# Patient Record
Sex: Male | Born: 1985 | Race: White | Hispanic: No | Marital: Married | Smoking: Current some day smoker
Health system: Southern US, Community
[De-identification: ages and names within clinical notes are randomized; demographics above are authoritative.]

## PROBLEM LIST (undated history)

## (undated) DIAGNOSIS — K219 Gastro-esophageal reflux disease without esophagitis: Secondary | ICD-10-CM

## (undated) DIAGNOSIS — F419 Anxiety disorder, unspecified: Secondary | ICD-10-CM

## (undated) DIAGNOSIS — B019 Varicella without complication: Secondary | ICD-10-CM

## (undated) HISTORY — DX: Anxiety disorder, unspecified: F41.9

## (undated) HISTORY — DX: Gastro-esophageal reflux disease without esophagitis: K21.9

## (undated) HISTORY — DX: Varicella without complication: B01.9

---

## 2006-11-04 ENCOUNTER — Other Ambulatory Visit: Payer: Self-pay

## 2006-11-04 ENCOUNTER — Emergency Department: Payer: Self-pay | Admitting: General Practice

## 2011-07-23 ENCOUNTER — Ambulatory Visit: Payer: Self-pay

## 2013-05-28 IMAGING — CT CT ABD-PELV W/O CM
1 of 2 series · 15 of 32 positions shown, 19 images · non-contrast
Comparison: None

REASON FOR EXAM: STAT CR [id number] severe abd pain
COMMENTS:

PROCEDURE:     CT  - CT ABDOMEN AND PELVIS W[DATE] [DATE]
RESULT:     Indication: Abdominal pain
TECHNIQUE: Multiple axial images from the lung bases to the symphysis pubis
were obtained without oral and without intravenous contrast.

[Series 2: soft tissue · axial · 0.67mm/px · z∈[-546,-138]mm · 15 of 150 slices shown, 19 images]
[im 7/150  soft-tissue]
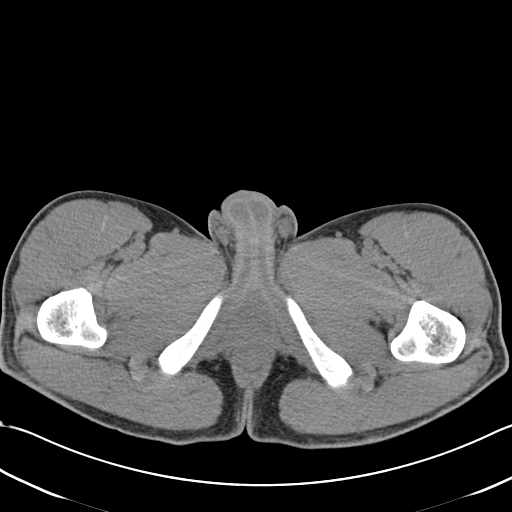
[im 7/150  bone]
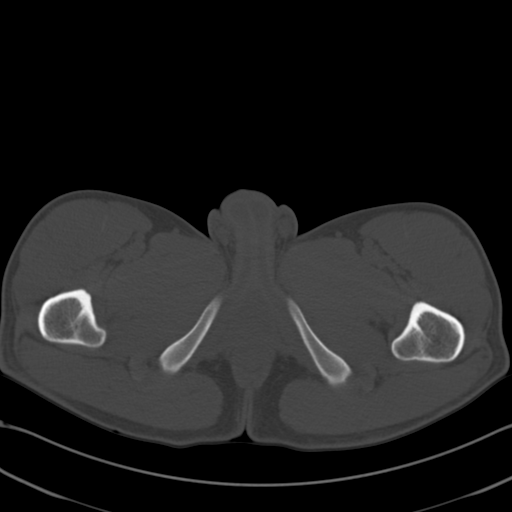
[im 19/150  soft-tissue]
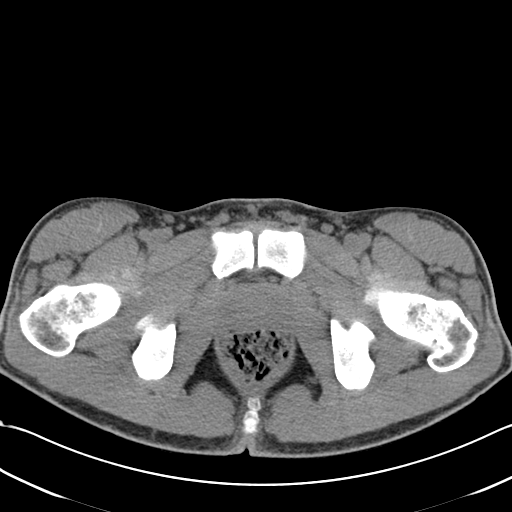
[im 32/150  soft-tissue]
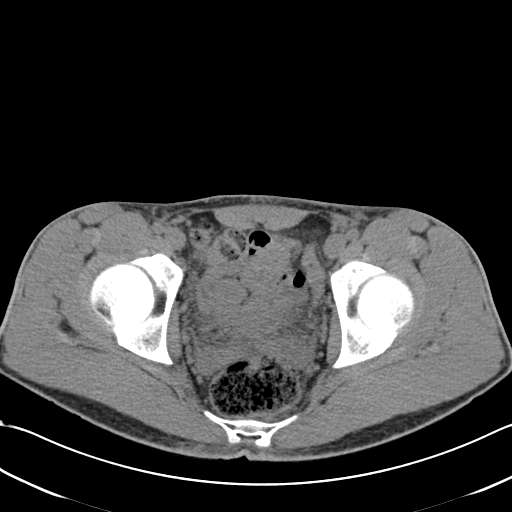
[im 44/150  soft-tissue]
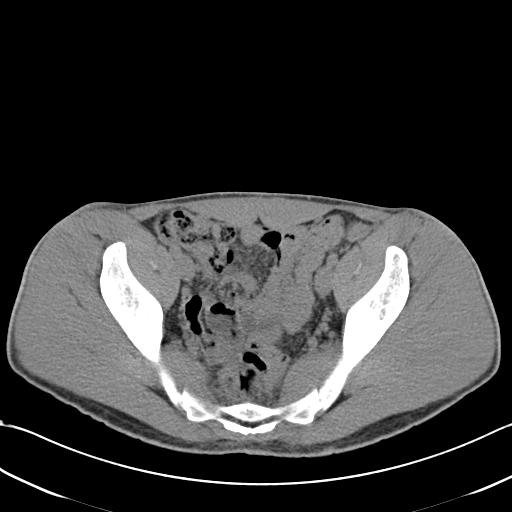
[im 50/150  soft-tissue]
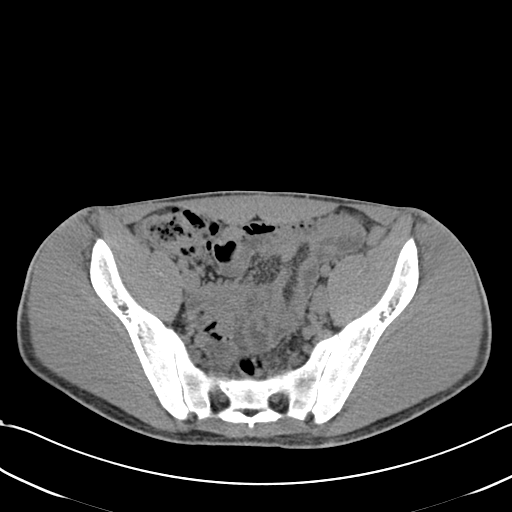
[im 63/150  soft-tissue]
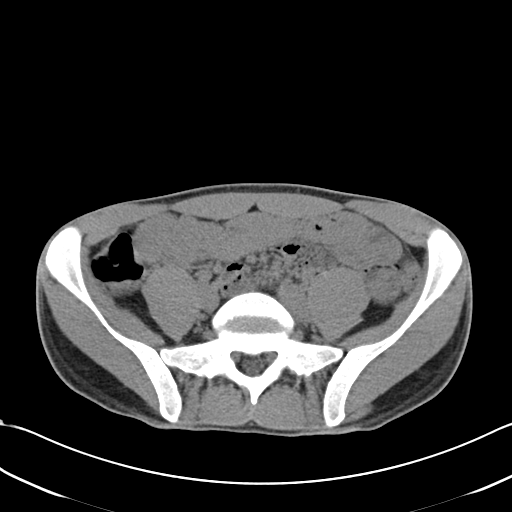
[im 75/150  soft-tissue]
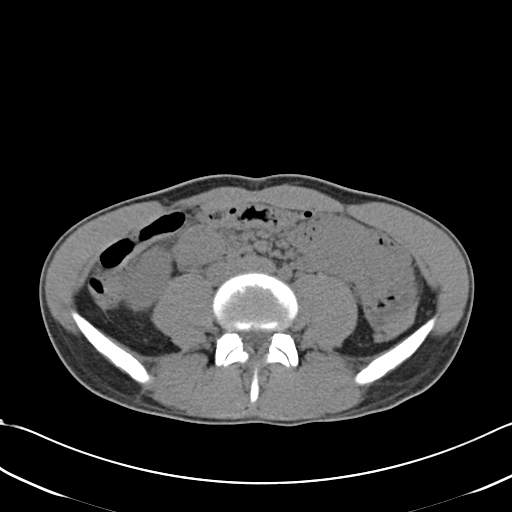
[im 87/150  soft-tissue]
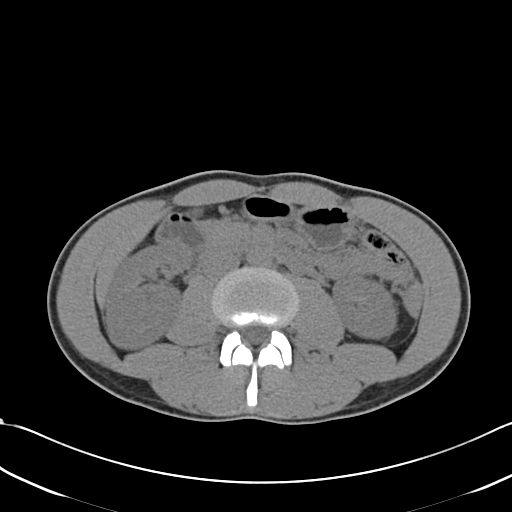
[im 100/150  soft-tissue]
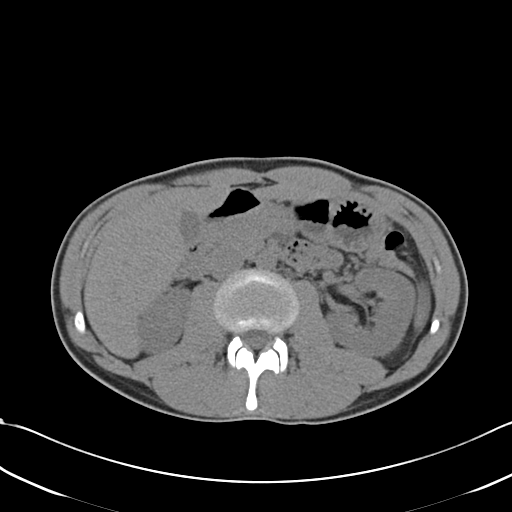
[im 100/150  bone]
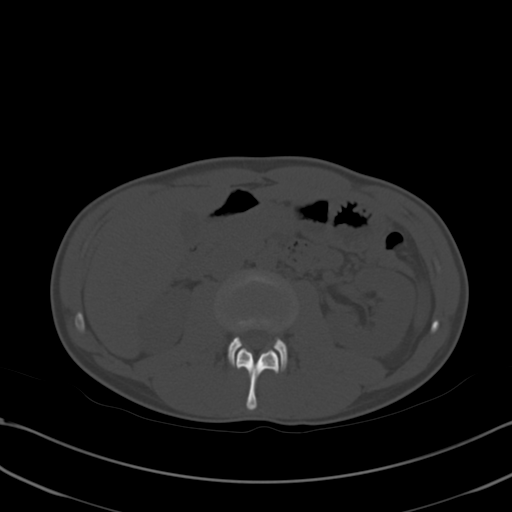
[im 106/150  soft-tissue]
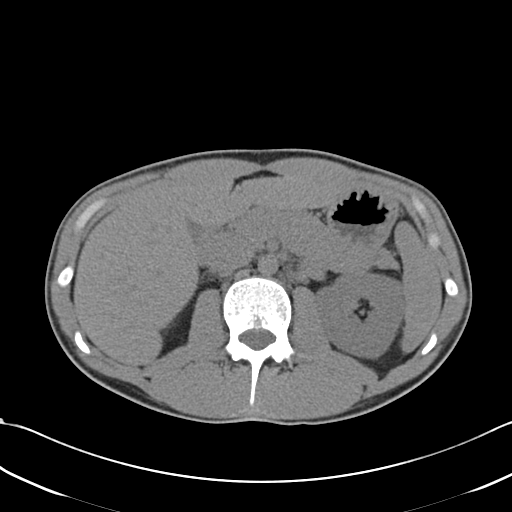
[im 118/150  soft-tissue]
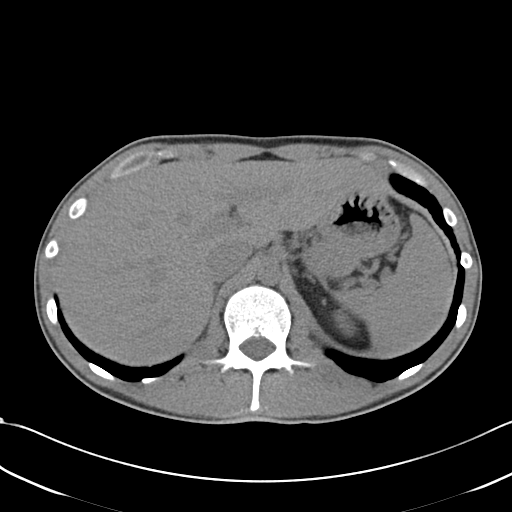
[im 125/150  lung]
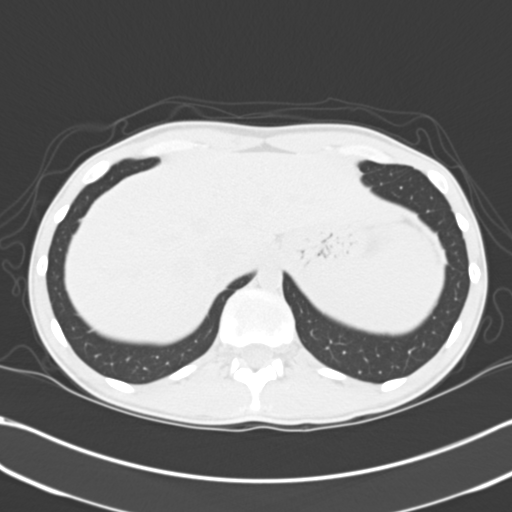
[im 131/150  soft-tissue]
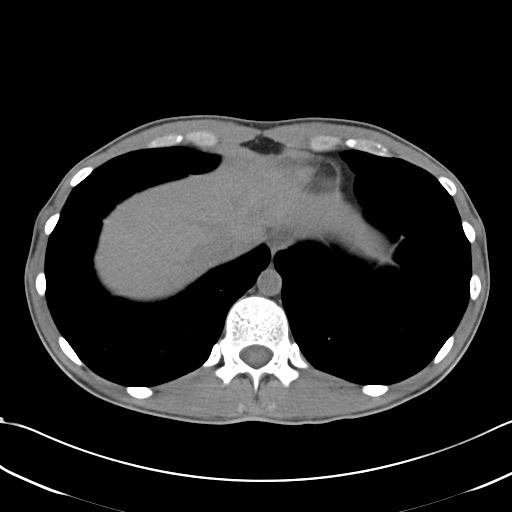
[im 131/150  lung]
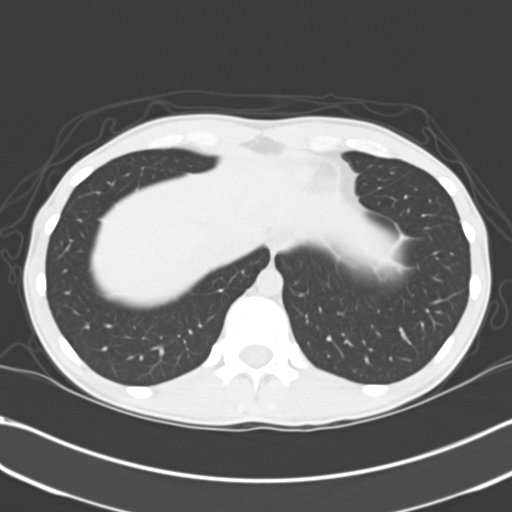
[im 137/150  lung]
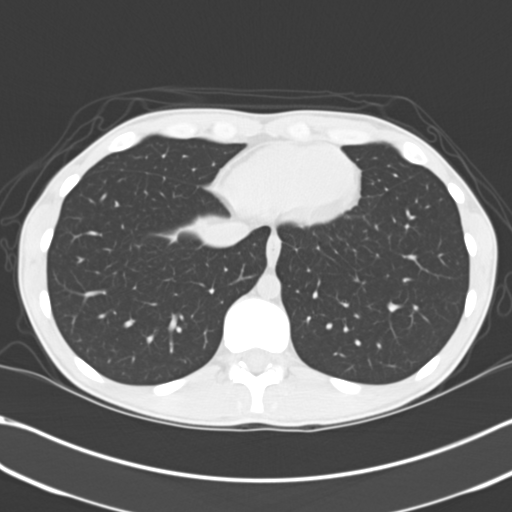
[im 143/150  soft-tissue]
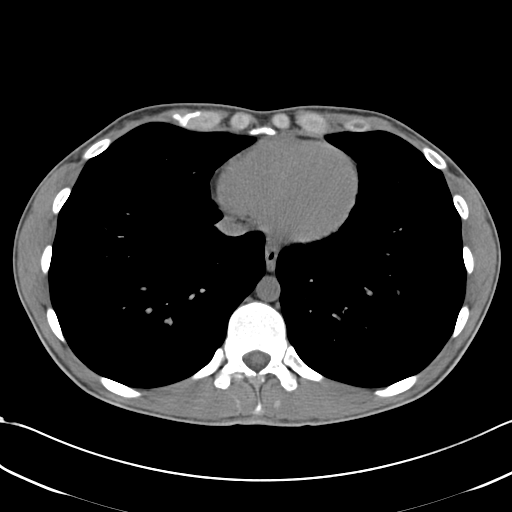
[im 143/150  lung]
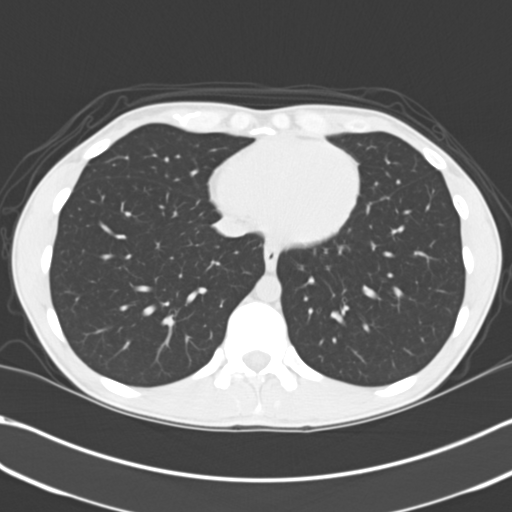

[15 of 32 positions shown; findings below may reference images not displayed]

FINDINGS: The lung bases are clear. There is no pleural or pericardial effusions.

There is a punctate nonobstructing right renal calculus. No obstructive
uropathy. No perinephric stranding is seen. The kidneys are symmetric in
size without evidence for exophytic mass. The bladder is unremarkable.

The liver demonstrates no focal abnormality. The gallbladder is
unremarkable. The spleen demonstrates no focal abnormality. The adrenal
glands and pancreas are normal.

The unopacified stomach, duodenum, small intestine, and large intestine are
unremarkable, but evaluation is limited by lack of oral contrast.  There is
no pneumoperitoneum, pneumatosis, or portal venous gas. There is no
abdominal or pelvic free fluid. There is no lymphadenopathy.

The abdominal aorta is normal in caliber .

The osseous structures are unremarkable.
IMPRESSION: 1. Punctate nonobstructing right renal calculus.

## 2017-11-03 DIAGNOSIS — B078 Other viral warts: Secondary | ICD-10-CM | POA: Diagnosis not present

## 2018-03-18 DIAGNOSIS — J020 Streptococcal pharyngitis: Secondary | ICD-10-CM | POA: Diagnosis not present

## 2018-03-18 DIAGNOSIS — J029 Acute pharyngitis, unspecified: Secondary | ICD-10-CM | POA: Diagnosis not present

## 2018-08-26 DIAGNOSIS — J0301 Acute recurrent streptococcal tonsillitis: Secondary | ICD-10-CM | POA: Diagnosis not present

## 2018-11-23 ENCOUNTER — Ambulatory Visit: Payer: Self-pay | Admitting: Family Medicine

## 2018-11-24 ENCOUNTER — Ambulatory Visit: Payer: Self-pay | Admitting: Family Medicine

## 2018-11-25 ENCOUNTER — Encounter: Payer: Self-pay | Admitting: Family Medicine

## 2018-11-25 ENCOUNTER — Ambulatory Visit: Payer: 59 | Admitting: Family Medicine

## 2018-11-25 VITALS — BP 100/60 | HR 77 | Temp 97.8°F | Resp 16 | Ht 71.0 in | Wt 156.4 lb

## 2018-11-25 DIAGNOSIS — M549 Dorsalgia, unspecified: Secondary | ICD-10-CM | POA: Diagnosis not present

## 2018-11-25 DIAGNOSIS — F419 Anxiety disorder, unspecified: Secondary | ICD-10-CM | POA: Diagnosis not present

## 2018-11-25 DIAGNOSIS — Z566 Other physical and mental strain related to work: Secondary | ICD-10-CM

## 2018-11-25 MED ORDER — HYDROXYZINE HCL 10 MG PO TABS: 10.0000 mg | ORAL_TABLET | Freq: Three times a day (TID) | ORAL | 1 refills | Status: DC | PRN

## 2018-11-25 NOTE — Progress Notes (Signed)
Subjective:    Patient ID: Keith Frost, male    DOB: 01/13/1986, 33 y.o.   MRN: [id number]  HPI   Patient presents in clinic to establish with PCP.  Main concern today is anxiety.  Patient has always struggled with anxiety over the years, but due to his job being very stressful and demanding his wife has noticed his anxiety is worse and would like him to have something for it.  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  This is been great for their business and making money, but is been very demanding on his time.  States he has been struggling to balance his work life and home life, he is married and has a young child.  He is hoping once they get all these jobs scheduled and managed for the busy season he will better be able to figure out how to balance work life and home life.  Denies any SI or HI.  Patient also has some pain in the left upper back.  He is working out at [employer] but more over the past few months to try and gain weight and muscle mass.  He did see the chiropractor approximately 6 months ago, had x-rays done which did not reveal any bony abnormality and had multiple adjustments performed.  Patient states since going to the chiropractor the pain seems somewhat better but he will notice it at times especially if he has to use both arms when running equipment at work.   Past Medical History:  Diagnosis Date  . Chicken pox   . GERD (gastroesophageal reflux disease)    Social History   Tobacco Use  . Smoking status: Former Games developer  . Smokeless tobacco: Never Used  Substance Use Topics  . Alcohol use: Yes    Comment: ocassionally   History reviewed. No pertinent surgical history.   Family History  Problem Relation Age of Onset  . Diabetes Father   . Stroke Father   . Hypertension Father   . Cancer Maternal Grandfather   . Heart attack Paternal Grandmother    . Diabetes Paternal Grandfather   . Arthritis Paternal Grandfather   . Heart attack Paternal Grandfather    Review of Systems  Constitutional: Negative for chills, fatigue and fever.  HENT: Negative for congestion, ear pain, sinus pain and sore throat.   Eyes: Negative.   Respiratory: Negative for cough, shortness of breath and wheezing.   Cardiovascular: Negative for chest pain, palpitations and leg swelling.  Gastrointestinal: Negative for abdominal pain, diarrhea, nausea and vomiting.  Genitourinary: Negative for dysuria, frequency and urgency.  Musculoskeletal: +upper back pain left Skin: Negative for color change, pallor and rash.  Neurological: Negative for syncope, light-headedness and headaches.  Psychiatric/Behavioral: The patient is anxious/increased stress.       Objective:   Physical Exam Vitals signs and nursing note reviewed.  Constitutional:      General: He is not in acute distress.    Appearance: He is not toxic-appearing.  HENT:     Head: Normocephalic and atraumatic.     Right Ear: Tympanic membrane, ear canal and external ear normal.     Left Ear: Tympanic membrane, ear canal and external ear normal.     Nose: Nose normal.     Mouth/Throat:     Mouth: Mucous membranes are moist.  Eyes:  General: No scleral icterus.    Extraocular Movements: Extraocular movements intact.     Pupils: Pupils are equal, round, and reactive to light.  Neck:     Musculoskeletal: Normal range of motion and neck supple. No neck rigidity.  Cardiovascular:     Rate and Rhythm: Normal rate and regular rhythm.  Pulmonary:     Effort: Pulmonary effort is normal. No respiratory distress.     Breath sounds: Normal breath sounds.  Abdominal:     General: Abdomen is flat. Bowel sounds are normal. There is no distension.     Palpations: Abdomen is soft.     Tenderness: There is no abdominal tenderness.  Musculoskeletal: Normal range of motion.       Arms:     Comments:  Location of left upper back pain indicated by red circle on diagram.  No issues with range of motion of left shoulder or neck.  No pain with palpation of AC joint.  No pain with palpation along spinous process.  Grips equal and strong.  Lymphadenopathy:     Cervical: No cervical adenopathy.  Skin:    General: Skin is warm and dry.     Coloration: Skin is not pale.  Neurological:     Mental Status: He is alert and oriented to person, place, and time.     Cranial Nerves: No cranial nerve deficit.     Motor: No weakness.     Gait: Gait normal.  Psychiatric:        Mood and Affect: Mood normal.        Behavior: Behavior normal.        Thought Content: Thought content normal.        Judgment: Judgment normal.    Vitals:   11/25/18 1346  BP: 100/60  Pulse: 77  Resp: 16  Temp: 97.8 F (36.6 C)  SpO2: 97%      Assessment & Plan:    A total of 30  minutes were spent face-to-face with the patient during this encounter and over half of that time was spent on counseling and coordination of care. The patient was counseled on anxiety management, medications, techniques to help reduce anxiety.    Anxiety, stress at work -- patient would prefer not to have to take something every day for anxiety control.  After long discussion, he is agreeable to trial hydroxyzine as needed for anxiety breakthrough.  Discussed different anxiety reduction techniques such as taking deep breaths, taking some time for himself to have solitude and reflect on his thoughts and reflect on his day.  Upper back pain on the left side -- discussed that all remedies he can try such as a heating pad, topical rub like BenGay or Biofreeze, using Tylenol or Motrin.  Patient states he has noticed that while working out at they gym and getting stronger, the pain does seem less and less.  Discussed different options do physical therapy or sports medicine if the pain continues to persist, states he will think about this and let me  know if he would like referral.  Patient will make follow-up visit and complete physical exam appointment when he is available, he is unsure of his work schedule at this time so he will call back and let us know.

## 2018-11-26 ENCOUNTER — Encounter: Payer: Self-pay | Admitting: Family Medicine

## 2018-11-26 DIAGNOSIS — Z566 Other physical and mental strain related to work: Secondary | ICD-10-CM | POA: Insufficient documentation

## 2018-11-26 DIAGNOSIS — M549 Dorsalgia, unspecified: Secondary | ICD-10-CM | POA: Insufficient documentation

## 2018-11-26 DIAGNOSIS — F419 Anxiety disorder, unspecified: Secondary | ICD-10-CM | POA: Insufficient documentation

## 2018-12-20 ENCOUNTER — Ambulatory Visit: Payer: Self-pay | Admitting: Family Medicine

## 2019-06-24 ENCOUNTER — Encounter: Payer: 59 | Admitting: Family Medicine

## 2019-06-24 DIAGNOSIS — Z0289 Encounter for other administrative examinations: Secondary | ICD-10-CM

## 2019-09-08 ENCOUNTER — Other Ambulatory Visit: Payer: Self-pay

## 2019-09-08 DIAGNOSIS — Z20822 Contact with and (suspected) exposure to covid-19: Secondary | ICD-10-CM

## 2019-09-12 LAB — NOVEL CORONAVIRUS, NAA: SARS-CoV-2, NAA: DETECTED — AB

## 2019-12-05 ENCOUNTER — Other Ambulatory Visit: Payer: Self-pay

## 2019-12-05 ENCOUNTER — Ambulatory Visit (INDEPENDENT_AMBULATORY_CARE_PROVIDER_SITE_OTHER): Payer: 59 | Admitting: Nurse Practitioner

## 2019-12-05 ENCOUNTER — Encounter: Payer: Self-pay | Admitting: Nurse Practitioner

## 2019-12-05 VITALS — BP 128/78 | HR 75 | Temp 97.2°F | Resp 18 | Ht 72.0 in | Wt 149.0 lb

## 2019-12-05 DIAGNOSIS — Q159 Congenital malformation of eye, unspecified: Secondary | ICD-10-CM | POA: Diagnosis not present

## 2019-12-05 DIAGNOSIS — F419 Anxiety disorder, unspecified: Secondary | ICD-10-CM | POA: Diagnosis not present

## 2019-12-05 DIAGNOSIS — F329 Major depressive disorder, single episode, unspecified: Secondary | ICD-10-CM

## 2019-12-05 DIAGNOSIS — F32A Depression, unspecified: Secondary | ICD-10-CM

## 2019-12-05 LAB — HEPATIC FUNCTION PANEL
ALT: 9 U/L (ref 0–53)
AST: 15 U/L (ref 0–37)
Albumin: 4.3 g/dL (ref 3.5–5.2)
Alkaline Phosphatase: 60 U/L (ref 39–117)
Bilirubin, Direct: 0.2 mg/dL (ref 0.0–0.3)
Total Bilirubin: 1 mg/dL (ref 0.2–1.2)
Total Protein: 6.9 g/dL (ref 6.0–8.3)

## 2019-12-05 LAB — BASIC METABOLIC PANEL
BUN: 10 mg/dL (ref 6–23)
CO2: 30 mEq/L (ref 19–32)
Calcium: 9.7 mg/dL (ref 8.4–10.5)
Chloride: 101 mEq/L (ref 96–112)
Creatinine, Ser: 0.94 mg/dL (ref 0.40–1.50)
GFR: 92.61 mL/min (ref >60.00)
Glucose, Bld: 81 mg/dL (ref 70–99)
Potassium: 4.1 mEq/L (ref 3.5–5.1)
Sodium: 139 mEq/L (ref 135–145)

## 2019-12-05 LAB — CBC
HCT: 44.4 % (ref 39.0–52.0)
Hemoglobin: 15.1 g/dL (ref 13.0–17.0)
MCHC: 34.1 g/dL (ref 30.0–36.0)
MCV: 95.8 fl (ref 78.0–100.0)
Platelets: 234 10*3/uL (ref 150.0–400.0)
RBC: 4.64 Mil/uL (ref 4.22–5.81)
RDW: 12.8 % (ref 11.5–15.5)
WBC: 6.3 10*3/uL (ref 4.0–10.5)

## 2019-12-05 LAB — TSH: TSH: 0.61 u[IU]/mL (ref 0.35–4.50)

## 2019-12-05 MED ORDER — SERTRALINE HCL 50 MG PO TABS: 50.0000 mg | ORAL_TABLET | Freq: Every day | ORAL | 3 refills | Status: DC

## 2019-12-05 NOTE — Progress Notes (Addendum)
His basic chemistry, CBC, and thyroid labs returned totally normal.  I did make a formal referral to the eye doctor and made changes to the after visit sheet. Please give Keith Frost a newer copy.

## 2019-12-05 NOTE — Progress Notes (Signed)
Subjective:    Patient ID: Keith Frost, male    DOB: May 03, 1986, 34 y.o.   MRN: [id number]  HPI  Patient is in today to follow up on anxiety and to establish care with a new [facility] provider.   Depression/Anxiety:  Keith Frost was last seen in the office 11/25/2018 for anxiety and was prescribed hydroxyzine 10 mg tid. Keith Frost does not remember taking the medication.   Keith Frost reports many year duration of worry, stress, anxiety and some depression, although Keith Frost does not like that  word. Keith Frost has never taken medication or sought counseling. Keith Frost is now agreeable to starting medication and counseling. Keith Frost reports exacerbation of his sx over the last 2-3 mos. Keith Frost has had a  "rough time" with his businees and marriage and feels constant stress, worry, irritability, and has a short temper. Keith Frost reports poor sleep:  bed around 23:00 and is up at 05:30. Keith Frost is constantly moving, and Keith Frost cannot sit still or relax. Keith Frost has trouble gaining weight. Keith Frost reports a normal diet and appetite.  Mother on anxiety medication.   Review of Systems  Constitutional: Negative.   Eyes:       Positive irritation in bilat eyes without injury,trauma of known FB. Vision reported as normal. No pain. No tearing.   Respiratory: Negative.  Negative for cough and shortness of breath.   Cardiovascular: Negative.  Negative for chest pain and palpitations.  Gastrointestinal: Negative.  Negative for abdominal pain, heartburn, nausea and vomiting.       Positive poor appetite and Keith Frost has always been a small man and would like to gain weight.   Genitourinary: Negative.  Negative for dysuria.  Musculoskeletal: Negative for myalgias.  Skin: Negative.   Neurological: Negative for dizziness, tremors, sensory change and headaches.  Psychological: Positive for depression and anxiety. Adamantly denies any SI or HI. PHQ -14     Objective:     Physical Exam  Constitutional: Keith Frost is oriented to person, place, and time. Keith Frost appears well-developed and well-nourished.  No distress.  Eyes: Bilateral eyes have a small spot of extra tissue beside the iris, right sclera is slightly reddened. Keith Frost has  Mouth/Throat: Not examined-wearing mask  Neck: Normal range of motion. No thyromegaly present.  Cardiovascular: Normal rate and regular rhythm.   No murmur heard. Pulmonary/Chest: Effort normal and breath sounds normal. No respiratory distress. Keith Frost has no wheezes. Keith Frost has no rales. Keith Frost exhibits no tenderness.  Musculoskeletal: Keith Frost exhibits no edema.  Lymphadenopathy:Keith Frost has no cervical adenopathy.  Neurological: Keith Frost is alert and oriented to person, place, and time. Skin: Skin is warm and dry.  Psychiatric: Keith Frost has an anxious, fidgeting, clear speech and answers all questions directly. Good eye contact and conversational. Judgment and thought content normal.        Assessment & Plan:   Anxiety/Depression: Keith Frost reports a long untreated Hx of anxiety and also tests high for depression on the screening PHQ test. Keith Frost was offered hydroxyzine medication last year and does not like to anything, so Keith Frost did not begin it. Keith Frost is willing to proceed with treatment now and will  begin with Zoloft 50 mg at one-half tab daily for one week and increase to 1 tablet daily.We discussed possible SE of weight gain and the patient states Keith Frost would welcome that as Keith Frost has always been small. Routine labs to check his thyroid, Bmet, CBC and liver panel for baseline labs. I  also recommend referral with Dr. Maryruth Bun for counseling and assistance with accurate  diagnosis as Keith Frost has a mixed picture today.   Bilateral eye complaint: Keith Frost has unusual looking small growths on both eyes by the iris. Keith Frost denies pain, FB in past or present, no burning, tearing and report normal vision. 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  Referral to ophthalmology for evaluation and treatment.   Follow up in one month to check response and monitor weight.  .   This visit occurred durisng the SARS-CoV-2 public health emergency.  Safety  protocols were in place, including screening questions prior to the visit, additional usage of staff PPE, and extensive cleaning of exam room while observing appropriate contact time as indicated for disinfecting solutions.

## 2019-12-05 NOTE — Patient Instructions (Addendum)
Please stop by the lab today for baseline labs. Begin Zoloft as directed: half tab for one week and then one whole tablet daily. Please monitor for weight gain. Our hope is for gradual improvement of mood since starting medication; however this may take several weeks.   We will place referrals for Behavioral Health to help you with chronic anxiety. Let me know if you have not been contacted for an appointment in 1-2 weeks.   We will place referral to [facility] for your eye complaints. Let me know if you have not been contacted about an appointment this week.  Follow up in one month to check response and weight.       Managing Anxiety, Adult After being diagnosed with an anxiety disorder, you may be relieved to know why you have felt or behaved a certain way. You may also feel overwhelmed about the treatment ahead and what it will mean for your life. With care and support, you can manage this condition and recover from it. How to manage lifestyle changes Managing stress and anxiety  Stress is your body's reaction to life changes and events, both good and bad. Most stress will last just a few hours, but stress can be ongoing and can lead to more than just stress. Although stress can play a major role in anxiety, it is not the same as anxiety. Stress is usually caused by something external, such as a deadline, test, or competition. Stress normally passes after the triggering event has ended.  Anxiety is caused by something internal, such as imagining a terrible outcome or worrying that something will go wrong that will devastate you. Anxiety often does not go away even after the triggering event is over, and it can become long-term (chronic) worry. It is important to understand the differences between stress and anxiety and to manage your stress effectively so that it does not lead to an anxious response. Talk with your health care provider or a counselor to learn more about reducing  anxiety and stress. He or she may suggest tension reduction techniques, such as:  Music therapy. This can include creating or listening to music that you enjoy and that inspires you.  Mindfulness-based meditation. This involves being aware of your normal breaths while not trying to control your breathing. It can be done while sitting or walking.  Centering prayer. This involves focusing on a word, phrase, or sacred image that means something to you and brings you peace.  Deep breathing. To do this, expand your stomach and inhale slowly through your nose. Hold your breath for 3-5 seconds. Then exhale slowly, letting your stomach muscles relax.  Self-talk. This involves identifying thought patterns that lead to anxiety reactions and changing those patterns.  Muscle relaxation. This involves tensing muscles and then relaxing them. Choose a tension reduction technique that suits your lifestyle and personality. These techniques take time and practice. Set aside 5-15 minutes a day to do them. Therapists can offer counseling and training in these techniques. The training to help with anxiety may be covered by some insurance plans. Other things you can do to manage stress and anxiety include:  Keeping a stress/anxiety diary. This can help you learn what triggers your reaction and then learn ways to manage your response.  Thinking about how you react to certain situations. You may not be able to control everything, but you can control your response.  Making time for activities that help you relax and not feeling guilty about spending  your time in this way.  Visual imagery and yoga can help you stay calm and relax.  Medicines Medicines can help ease symptoms. Medicines for anxiety include:  Anti-anxiety drugs.  Antidepressants. Medicines are often used as a primary treatment for anxiety disorder. Medicines will be prescribed by a health care provider. When used together, medicines, psychotherapy,  and tension reduction techniques may be the most effective treatment. Relationships Relationships can play a big part in helping you recover. Try to spend more time connecting with trusted friends and family members. Consider going to couples counseling, taking family education classes, or going to family therapy. Therapy can help you and others better understand your condition. How to recognize changes in your anxiety Everyone responds differently to treatment for anxiety. Recovery from anxiety happens when symptoms decrease and stop interfering with your daily activities at home or work. This may mean that you will start to:  Have better concentration and focus. Worry will interfere less in your daily thinking.  Sleep better.  Be less irritable.  Have more energy.  Have improved memory. It is important to recognize when your condition is getting worse. Contact your health care provider if your symptoms interfere with home or work and you feel like your condition is not improving. Follow these instructions at home: Activity  Exercise. Most adults should do the following: ? Exercise for at least 150 minutes each week. The exercise should increase your heart rate and make you sweat (moderate-intensity exercise). ? Strengthening exercises at least twice a week.  Get the right amount and quality of sleep. Most adults need 7-9 hours of sleep each night. Lifestyle   Eat a healthy diet that includes plenty of vegetables, fruits, whole grains, low-fat dairy products, and lean protein. Do not eat a lot of foods that are high in solid fats, added sugars, or salt.  Make choices that simplify your life.  Do not use any products that contain nicotine or tobacco, such as cigarettes, e-cigarettes, and chewing tobacco. If you need help quitting, ask your health care provider.  Avoid caffeine, alcohol, and certain over-the-counter cold medicines. These may make you feel worse. Ask your pharmacist  which medicines to avoid. General instructions  Take over-the-counter and prescription medicines only as told by your health care provider.  Keep all follow-up visits as told by your health care provider. This is important. Where to find support You can get help and support from these sources:  Self-help groups.  Online and [employer].  A trusted spiritual leader.  Couples counseling.  Family education classes.  Family therapy. Where to find more information You may find that joining a support group helps you deal with your anxiety. The following sources can help you locate counselors or support groups near you:  Mental Health America: www.mentalhealthamerica.net  Anxiety and Depression Association of [place] (ADAA): ProgramCam.de  [employer] on Mental Illness (NAMI): www.nami.org Contact a health care provider if you:  Have a hard time staying focused or finishing daily tasks.  Spend many hours a day feeling worried about everyday life.  Become exhausted by worry.  Start to have headaches, feel tense, or have nausea.  Urinate more than normal.  Have diarrhea. Get help right away if you have:  A racing heart and shortness of breath.  Thoughts of hurting yourself or others. If you ever feel like you may hurt yourself or others, or have thoughts about taking your own life, get help right away. You can go to your nearest  emergency department or call:  Your local emergency services (911 in the [place]  A suicide crisis helpline, such as the [facility] at 985-826-3385. This is open 24 hours a day. Summary  Taking steps to learn and use tension reduction techniques can help calm you and help prevent triggering an anxiety reaction.  When used together, medicines, psychotherapy, and tension reduction techniques may be the most effective treatment.  Family, friends, and partners can play a big part in helping you recover  from an anxiety disorder. This information is not intended to replace advice given to you by your health care provider. Make sure you discuss any questions you have with your health care provider. Document Revised: 02/22/2019 Document Reviewed: 02/22/2019 Elsevier Patient Education  [employer].

## 2019-12-13 ENCOUNTER — Other Ambulatory Visit: Payer: Self-pay

## 2019-12-13 ENCOUNTER — Ambulatory Visit (INDEPENDENT_AMBULATORY_CARE_PROVIDER_SITE_OTHER): Payer: 59 | Admitting: Licensed Clinical Social Worker

## 2019-12-13 ENCOUNTER — Encounter: Payer: Self-pay | Admitting: Licensed Clinical Social Worker

## 2019-12-13 DIAGNOSIS — F419 Anxiety disorder, unspecified: Secondary | ICD-10-CM

## 2019-12-13 NOTE — Progress Notes (Signed)
Comprehensive Clinical Assessment (CCA) Note  12/13/2019 Keith Frost [id number]  Visit Diagnosis:      ICD-10-CM   1. Anxiety  F41.9       CCA Part One  Part One has been completed on paper by the patient.  (See scanned document in Chart Review)  CCA Part Two A  Intake/Chief Complaint:  CCA Intake With Chief Complaint CCA Part Two Date: 12/13/19 CCA Part Two Time: 1300 Chief Complaint/Presenting Problem: Pt presents as a 34 year old, Caucasian, married male for assessment. Pt was referred by his physician and is seeking counseling for anxiety. Pt reported "I went to my doctor because my family was on me about my anxiety. I run my own business, currently experiencing marital issues, and my stress messes with my appetite". Pt reported desire to find ways to better manage his anxiety. Patients Currently Reported Symptoms/Problems: Anxiety, Marital Conflict, Work Related Stress Collateral Involvement: N/A Individual's Strengths: Pt reported "I try to tough things out and don't like to take medicine". Individual's Preferences: Pt reported no expectations since this is his first time in therapy and that he has "always been against talking to someone about my problems". However, patient is willing to give it a try at encouragment of doctor and family. 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 Type of Services Patient Feels Are Needed: Individual Therapy Initial Clinical Notes/Concerns: N/A  Mental Health Symptoms Depression:  Depression: Fatigue, Sleep (too much or little), Increase/decrease in appetite, Weight gain/loss  Mania:  Mania: N/A  Anxiety:   Anxiety: Worrying, Fatigue, Irritability, Sleep, Tension(shortness of breath)  Psychosis:  Psychosis: N/A  Trauma:  Trauma: N/A  Obsessions:  Obsessions: Intrusive/time consuming  Compulsions:  Compulsions: "Driven" to perform behaviors/acts(Pt reported he needs to check to make sure door  is locked several times and must sit near exits at restaurants.)  Inattention:  Inattention: Disorganized, Forgetful, Loses things  Hyperactivity/Impulsivity:  Hyperactivity/Impulsivity: N/A  Oppositional/Defiant Behaviors:  Oppositional/Defiant Behaviors: N/A  Borderline Personality:  Emotional Irregularity: N/A  Other Mood/Personality Symptoms:  Other Mood/Personality Symtpoms: N/A   Mental Status Exam Appearance and self-care  Stature:  Stature: Average  Weight:  Weight: Thin  Clothing:  Clothing: Casual  Grooming:  Grooming: Normal  Cosmetic use:  Cosmetic Use: None  Posture/gait:  Posture/Gait: Normal  Motor activity:  Motor Activity: Not Remarkable  Sensorium  Attention:  Attention: Normal  Concentration:  Concentration: Normal  Orientation:  Orientation: X5  Recall/memory:  Recall/Memory: Normal  Affect and Mood  Affect:  Affect: Appropriate  Mood:  Mood: Anxious  Relating  Eye contact:  Eye Contact: Normal  Facial expression:  Facial Expression: Responsive  Attitude toward examiner:  Attitude Toward Examiner: Cooperative  Thought and Language  Speech flow: Speech Flow: Normal  Thought content:  Thought Content: Appropriate to mood and circumstances  Preoccupation:  Preoccupations: (N/A)  Hallucinations:  Hallucinations: (N/A)  Organization:   Logical  Company secretary of Knowledge:  Fund of Knowledge: Average  Intelligence:  Intelligence: Average  Abstraction:  Abstraction: Normal  Judgement:  Judgement: Fair  Dance movement psychotherapist:  Reality Testing: Adequate  Insight:  Insight: Flashes of insight, Gaps  Decision Making:  Decision Making: Normal  Social Functioning  Social Maturity:  Social Maturity: Responsible  Social Judgement:  Social Judgement: Normal  Stress  Stressors:  Stressors: Family conflict, Work(Marital conflict)  Coping Ability:  Coping Ability: Building surveyor Deficits:   Pt reported needing ways to better manage anxiety  Supports:    Family encouraging patient to seek help   Family and Psychosocial History: Family history Marital status: Married Number of Years Married: 3 What types of issues is patient dealing with in the relationship?: Pt reported spouse is not supportive of pt working too many hours and c/o not enough time spent together. Additional relationship information: Together for 7 years Are you sexually active?: Yes What is your sexual orientation?: Straight Does patient have children?: Yes How many children?: 1 How is patient's relationship with their children?: Pt reported "great".  Childhood History:  Childhood History By whom was/is the patient raised?: Both parents, Grandparents Additional childhood history information: Parents divorced and patient went back and forth between them, at age 29 patient went to live with his grandparents. Description of patient's relationship with caregiver when they were a child: Pt reported "I had a good childhood". Patient's description of current relationship with people who raised him/her: Pt reported he continues to have a close and positive relationship with parents. How were you disciplined when you got in trouble as a child/adolescent?: Pt reported "I never got beat on, just whoopings". Does patient have siblings?: Yes Number of Siblings: 1 Description of patient's current relationship with siblings: Pt reported he and brother had some disgareement over running a business together and do not interact as much for last few months. 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 How long has patient been employed?: 3 years Patient's job has been impacted by current illness: Yes Describe how patient's job has been impacted: Pt reported that frustrations at times get the better of him and can sometimes limit motivation to perform. Did You Receive Any Psychiatric Treatment/Services While in the [place]?: No Are There Guns or Other Weapons in [place]?: Yes Types of Guns/Weapons: 8-10 kept in a safe, several in bedroom and carries a gun on his person with permit Are These Weapons Safely Secured?: Yes  Education: Education Last Grade Completed: 12 Did Teacher, adult education From [employer]?: Yes Did [place]?: No Did [place]?: No Did You Have An Individualized Education Program (IIEP): No Did You Have Any Difficulty At School?: No  Religion: Religion/Spirituality Are You A Religious Person?: Yes What is Your Religious Affiliation?: None  Leisure/Recreation: Leisure / Recreation Leisure and Hobbies: Pt reported "golf and going on vacations".  Exercise/Diet: Exercise/Diet Do You Exercise?: No Have You Gained or Lost A Significant Amount of Weight in the Past Six Months?: Yes-Lost Number of Pounds Lost?: 20 Do You Follow a Special Diet?: No Do You Have Any Trouble Sleeping?: Yes Explanation of Sleeping Difficulties: Pt reported only getting about 4-5 hours of sleep most nights.  CCA Part Two C  Alcohol/Drug Use: Alcohol / Drug Use Pain Medications: N/A Prescriptions: N/A Over the Counter: N/A History of alcohol / drug use?: Yes(Pt reported occasional alcohol and marijuana use. Pt denied facing current or past problems w/ substance use.) Longest period of sobriety (when/how long): N/A Negative Consequences of Use: (N/A) Withdrawal Symptoms: (N/A)  CCA Part  Three  Substance use Disorder (SUD) Substance Use Disorder (SUD)  Checklist Symptoms of Substance Use: (N/A)  Social Function:  Social Functioning Social Maturity: Responsible Social Judgement: Normal  Stress:  Stress Stressors: Family conflict, Work(Marital conflict) Coping Ability: Overwhelmed Patient Takes Medications The Way The Doctor Instructed?: Other (Comment)(started last week) Priority Risk: Low Acuity  Risk Assessment- Self-Harm Potential: Risk Assessment For Self-Harm Potential Thoughts of Self-Harm: No current thoughts Method: No plan Availability of Means: No access/NA Additional Information for Self-Harm Potential: (N/A) Additional Comments for Self-Harm Potential: N/A  Risk Assessment -Dangerous to Others Potential: Risk Assessment For Dangerous to Others Potential Method: No Plan Availability of Means: No access or NA Intent: Vague intent or NA Notification Required: No need or identified person Additional Information for Danger to Others Potential: (N/A) Additional Comments for Danger to Others Potential: N/A  DSM5 Diagnoses: Patient Active Problem List   Diagnosis Date Noted  . Anxiety 11/26/2018  . Stress at work 11/26/2018  . Upper back pain on left side 11/26/2018    Patient Centered Plan: Patient is on the following Treatment Plan(s):  Anxiety  Recommendations for Services/Supports/Treatments: Recommendations for Services/Supports/Treatments Recommendations For Services/Supports/Treatments: Individual Therapy  Treatment Plan Summary: Long Term Goal: Enhance ability to handle effectively the full variety of life's anxieties.  Short Term Goals: . Identify the major life conflicts from the past and present that form the basis for present anxiety . Describe current and past experiences with specific fears, prominent worries, and anxiety symptoms including their impact on functioning and attempts to resolve it . Identify an anxiety coping  mechanism that has been successful in the past and increase its use . Increase understanding of beliefs and messages that produce the worry and anxiety  Jourdyn Ferrin Arnette Felts, LCSW, LCAS

## 2019-12-19 DIAGNOSIS — H11001 Unspecified pterygium of right eye: Secondary | ICD-10-CM | POA: Diagnosis not present

## 2019-12-26 ENCOUNTER — Ambulatory Visit (INDEPENDENT_AMBULATORY_CARE_PROVIDER_SITE_OTHER): Payer: 59 | Admitting: Licensed Clinical Social Worker

## 2019-12-26 ENCOUNTER — Other Ambulatory Visit: Payer: Self-pay

## 2019-12-26 ENCOUNTER — Encounter: Payer: Self-pay | Admitting: Licensed Clinical Social Worker

## 2019-12-26 DIAGNOSIS — F419 Anxiety disorder, unspecified: Secondary | ICD-10-CM
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
## 2020-01-05 ENCOUNTER — Ambulatory Visit: Payer: 59 | Admitting: Nurse Practitioner

## 2020-01-11 ENCOUNTER — Ambulatory Visit: Payer: 59 | Admitting: Licensed Clinical Social Worker

## 2020-01-11 ENCOUNTER — Ambulatory Visit: Payer: 59 | Admitting: Nurse Practitioner

## 2020-01-11 DIAGNOSIS — Z0289 Encounter for other administrative examinations: Secondary | ICD-10-CM

## 2020-03-12 ENCOUNTER — Telehealth: Payer: Self-pay | Admitting: Nurse Practitioner

## 2020-03-12 ENCOUNTER — Ambulatory Visit: Payer: 59 | Admitting: Nurse Practitioner

## 2020-03-12 ENCOUNTER — Other Ambulatory Visit: Payer: Self-pay

## 2020-03-12 ENCOUNTER — Encounter: Payer: Self-pay | Admitting: Nurse Practitioner

## 2020-03-12 VITALS — BP 118/80 | HR 91 | Temp 97.9°F | Ht 72.0 in | Wt 149.4 lb

## 2020-03-12 DIAGNOSIS — Z8679 Personal history of other diseases of the circulatory system: Secondary | ICD-10-CM | POA: Insufficient documentation

## 2020-03-12 DIAGNOSIS — F410 Panic disorder [episodic paroxysmal anxiety] without agoraphobia: Secondary | ICD-10-CM | POA: Diagnosis not present

## 2020-03-12 DIAGNOSIS — F419 Anxiety disorder, unspecified: Secondary | ICD-10-CM | POA: Diagnosis not present
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
## 2020-03-12 NOTE — Patient Instructions (Addendum)
Please restart Zoloft at 25 mg nightly for 4 nights and then go up to 50 mg nightly to continue.  I have placed referral to psychiatry to see you while Keith Frost is on maternity leave.    I have placed a consult for cardiology  to check your heart out because you had that severe hypertensive crisis issue.  Please follow-up in the office in 4 weeks for recheck on Zoloft and see how you are getting along.  Zoloft: We discussed common side effects such as nausea, drowsiness and weight gain.  Also discussed rare but serious side effect of suicide ideation.  He is instructed to discontinue medication go directly to ED if this occurs.  Pt verbalizes understanding.  Plan follow up in 1 month to evaluate progress.    If you start to have unusual thoughts, thoughts of hurting yourself, or anyone else, please go immediately to the emergency department.   Please text to [phone] and write the word 'home'. This will put you in touch with trained crisis counselor and resources.    Panic Attack A panic attack is a sudden episode of severe anxiety, fear, or discomfort that causes physical and emotional symptoms. The attack may be in response to something frightening, or it may occur for no known reason. Symptoms of a panic attack can be similar to symptoms of a heart attack or stroke. It is important to see your health care provider when you have a panic attack so that these conditions can be ruled out. A panic attack is a symptom of another condition. Most panic attacks go away with treatment of the underlying problem. If you have panic attacks often, you may have a condition called panic disorder. What are the causes? A panic attack may be caused by:  An extreme, life-threatening situation, such as a war or natural disaster.  An anxiety disorder, such as post-traumatic stress disorder.  Depression.  Certain medical conditions, including heart problems, neurological conditions, and infections.  Certain  over-the-counter and prescription medicines.  Illegal drugs that increase heart rate and blood pressure, such as methamphetamine.  Alcohol.  Supplements that increase anxiety.  Panic disorder. What increases the risk? You are more likely to develop this condition if:  You have an anxiety disorder.  You have another mental health condition.  You take certain medicines.  You use alcohol, illegal drugs, or other substances.  You are under extreme stress.  A life event is causing increased feelings of anxiety and depression. What are the signs or symptoms? A panic attack starts suddenly, usually lasts about 20 minutes, and occurs with one or more of the following:  A pounding heart.  A feeling that your heart is beating irregularly or faster than normal (palpitations).  Sweating.  Trembling or shaking.  Shortness of breath or feeling smothered.  Feeling choked.  Chest pain or discomfort.  Nausea or a strange feeling in your stomach.  Dizziness, feeling lightheaded, or feeling like you might faint.  Chills or hot flashes.  Numbness or tingling in your lips, hands, or feet.  Feeling confused, or feeling that you are not yourself.  Fear of losing control or being emotionally unstable.  Fear of dying. How is this diagnosed? A panic attack is diagnosed with an assessment by your health care provider. During the assessment your health care provider will ask questions about:  Your history of anxiety, depression, and panic attacks.  Your medical history.  Whether you drink alcohol, use illegal drugs, take supplements,  or take medicines. Be honest about your substance use. Your health care provider may also:  Order blood tests or other kinds of tests to rule out serious medical conditions.  Refer you to a mental health professional for further evaluation. How is this treated? Treatment depends on the cause of the panic attack:  If the cause is a medical  problem, your health care provider will either treat that problem or refer you to a specialist.  If the cause is emotional, you may be given anti-anxiety medicines or referred to a counselor. These medicines may reduce how often attacks happen, reduce how severe the attacks are, and lower anxiety.  If the cause is a medicine, your health care provider may tell you to stop the medicine, change your dose, or take a different medicine.  If the cause is a drug, treatment may involve letting the drug wear off and taking medicine to help the drug leave your body or to counteract its effects. Attacks caused by drug abuse may continue even if you stop using the drug. Follow these instructions at home:  Take over-the-counter and prescription medicines only as told by your health care provider.  If you feel anxious, limit your caffeine intake.  Take good care of your physical and mental health by: ? Eating a balanced diet that includes plenty of fresh fruits and vegetables, whole grains, lean meats, and low-fat dairy. ? Getting plenty of rest. Try to get 7-8 hours of uninterrupted sleep each night. ? Exercising regularly. Try to get 30 minutes of physical activity at least 5 days a week. ? Not smoking. Talk to your health care provider if you need help quitting. ? Limiting alcohol intake to no more than 1 drink a day for nonpregnant women and 2 drinks a day for men. One drink equals 12 oz of beer, 5 oz of wine, or 1 oz of hard liquor.  Keep all follow-up visits as told by your health care provider. This is important. Panic attacks may have underlying physical or emotional problems that take time to accurately diagnose. Contact a health care provider if:  Your symptoms do not improve, or they get worse.  You are not able to take your medicine as prescribed because of side effects. Get help right away if:  You have serious thoughts about hurting yourself or others.  You have symptoms of a panic  attack. Do not drive yourself to the hospital. Have someone else drive you or call an ambulance. If you ever feel like you may hurt yourself or others, or you have thoughts about taking your own life, get help right away. You can go to your nearest emergency department or call:  Your local emergency services (911 in the [place]  A suicide crisis helpline, such as the National Suicide Prevention Lifeline at 773-390-1316. This is open 24 hours a day. Summary  A panic attack is a sign of a serious health or mental health condition. Get help right away. Do not drive yourself to the hospital. Have someone else drive you or call an ambulance.  Always see a health care provider to have the reasons for the panic attack correctly diagnosed.  If your panic attack was caused by a physical problem, follow your health care provider's suggestions for medicine, referral to a specialist, and lifestyle changes.  If your panic attack was caused by an emotional problem, follow through with counseling from a qualified mental health specialist.  If you feel like you may hurt yourself  or others, call 911 and get help right away. This information is not intended to replace advice given to you by your health care provider. Make sure you discuss any questions you have with your health care provider. Document Revised: 09/04/2017 Document Reviewed: 10/31/2016 Elsevier Patient Education  2020 Elsevier Inc.  Managing Anxiety, Adult After being diagnosed with an anxiety disorder, you may be relieved to know why you have felt or behaved a certain way. You may also feel overwhelmed about the treatment ahead and what it will mean for your life. With care and support, you can manage this condition and recover from it. How to manage lifestyle changes Managing stress and anxiety  Stress is your body's reaction to life changes and events, both good and bad. Most stress will last just a few hours, but stress can be ongoing  and can lead to more than just stress. Although stress can play a major role in anxiety, it is not the same as anxiety. Stress is usually caused by something external, such as a deadline, test, or competition. Stress normally passes after the triggering event has ended.  Anxiety is caused by something internal, such as imagining a terrible outcome or worrying that something will go wrong that will devastate you. Anxiety often does not go away even after the triggering event is over, and it can become long-term (chronic) worry. It is important to understand the differences between stress and anxiety and to manage your stress effectively so that it does not lead to an anxious response. Talk with your health care provider or a counselor to learn more about reducing anxiety and stress. He or she may suggest tension reduction techniques, such as:  Music therapy. This can include creating or listening to music that you enjoy and that inspires you.  Mindfulness-based meditation. This involves being aware of your normal breaths while not trying to control your breathing. It can be done while sitting or walking.  Centering prayer. This involves focusing on a word, phrase, or sacred image that means something to you and brings you peace.  Deep breathing. To do this, expand your stomach and inhale slowly through your nose. Hold your breath for 3-5 seconds. Then exhale slowly, letting your stomach muscles relax.  Self-talk. This involves identifying thought patterns that lead to anxiety reactions and changing those patterns.  Muscle relaxation. This involves tensing muscles and then relaxing them. Choose a tension reduction technique that suits your lifestyle and personality. These techniques take time and practice. Set aside 5-15 minutes a day to do them. Therapists can offer counseling and training in these techniques. The training to help with anxiety may be covered by some insurance plans. Other things you  can do to manage stress and anxiety include:  Keeping a stress/anxiety diary. This can help you learn what triggers your reaction and then learn ways to manage your response.  Thinking about how you react to certain situations. You may not be able to control everything, but you can control your response.  Making time for activities that help you relax and not feeling guilty about spending your time in this way.  Visual imagery and yoga can help you stay calm and relax.  Medicines Medicines can help ease symptoms. Medicines for anxiety include:  Anti-anxiety drugs.  Antidepressants. Medicines are often used as a primary treatment for anxiety disorder. Medicines will be prescribed by a health care provider. When used together, medicines, psychotherapy, and tension reduction techniques may be the most effective treatment.  Relationships Relationships can play a big part in helping you recover. Try to spend more time connecting with trusted friends and family members. Consider going to couples counseling, taking family education classes, or going to family therapy. Therapy can help you and others better understand your condition. How to recognize changes in your anxiety Everyone responds differently to treatment for anxiety. Recovery from anxiety happens when symptoms decrease and stop interfering with your daily activities at home or work. This may mean that you will start to:  Have better concentration and focus. Worry will interfere less in your daily thinking.  Sleep better.  Be less irritable.  Have more energy.  Have improved memory. It is important to recognize when your condition is getting worse. Contact your health care provider if your symptoms interfere with home or work and you feel like your condition is not improving. Follow these instructions at home: Activity  Exercise. Most adults should do the following: ? Exercise for at least 150 minutes each week. The exercise  should increase your heart rate and make you sweat (moderate-intensity exercise). ? Strengthening exercises at least twice a week.  Get the right amount and quality of sleep. Most adults need 7-9 hours of sleep each night. Lifestyle   Eat a healthy diet that includes plenty of vegetables, fruits, whole grains, low-fat dairy products, and lean protein. Do not eat a lot of foods that are high in solid fats, added sugars, or salt.  Make choices that simplify your life.  Do not use any products that contain nicotine or tobacco, such as cigarettes, e-cigarettes, and chewing tobacco. If you need help quitting, ask your health care provider.  Avoid caffeine, alcohol, and certain over-the-counter cold medicines. These may make you feel worse. Ask your pharmacist which medicines to avoid. General instructions  Take over-the-counter and prescription medicines only as told by your health care provider.  Keep all follow-up visits as told by your health care provider. This is important. Where to find support You can get help and support from these sources:  Self-help groups.  Online and [employer].  A trusted spiritual leader.  Couples counseling.  Family education classes.  Family therapy. Where to find more information You may find that joining a support group helps you deal with your anxiety. The following sources can help you locate counselors or support groups near you:  [place]: www.mentalhealthamerica.net  Anxiety and Depression Association of [place] (ADAA): https://www.clark.net/  National Alliance on Mental Illness (NAMI): www.nami.org Contact a health care provider if you:  Have a hard time staying focused or finishing daily tasks.  Spend many hours a day feeling worried about everyday life.  Become exhausted by worry.  Start to have headaches, feel tense, or have nausea.  Urinate more than normal.  Have diarrhea. Get help right away if you  have:  A racing heart and shortness of breath.  Thoughts of hurting yourself or others. If you ever feel like you may hurt yourself or others, or have thoughts about taking your own life, get help right away. You can go to your nearest emergency department or call:  Your local emergency services (911 in the [place]  A suicide crisis helpline, such as the [facility] at (440)162-1080. This is open 24 hours a day. Summary  Taking steps to learn and use tension reduction techniques can help calm you and help prevent triggering an anxiety reaction.  When used together, medicines, psychotherapy, and tension reduction techniques may be  the most effective treatment.  Family, friends, and partners can play a big part in helping you recover from an anxiety disorder. This information is not intended to replace advice given to you by your health care provider. Make sure you discuss any questions you have with your health care provider. Document Revised: 02/22/2019 Document Reviewed: 02/22/2019 Elsevier Patient Education  2020 [employer]

## 2020-03-13 NOTE — Telephone Encounter (Signed)
Tried to call back but NANM; will try back later today

## 2020-03-16 ENCOUNTER — Encounter: Payer: Self-pay | Admitting: Cardiology

## 2020-03-16 ENCOUNTER — Other Ambulatory Visit: Payer: Self-pay

## 2020-03-16 ENCOUNTER — Ambulatory Visit (INDEPENDENT_AMBULATORY_CARE_PROVIDER_SITE_OTHER): Payer: 59 | Admitting: Cardiology

## 2020-03-16 VITALS — BP 130/80 | HR 97 | Ht 72.0 in | Wt 147.4 lb

## 2020-03-16 DIAGNOSIS — Z8679 Personal history of other diseases of the circulatory system: Secondary | ICD-10-CM

## 2020-03-16 NOTE — Progress Notes (Signed)
Cardiology Office Note:    Date:  03/16/2020   ID:  Keith Frost, DOB 02/04/1986, MRN [id number]  PCP:  Theadore Nan, NP  [facility] Cardiologist:  Debbe Odea, MD  [facility] Electrophysiologist:  None   Referring MD: Theadore Nan, NP   Chief Complaint  Patient presents with  . New Patient (Initial Visit)    Patient reports hypertensive episode one week ago with tingling sensation in his chest. Patient also reports hx of anxiety/stress.   Keith Frost is a 34 y.o. male who is being seen today for the evaluation of hypertension at the request of Theadore Nan, NP.   History of Present Illness:    Keith Frost is a 34 y.o. male with a hx of anxiety, GERD who presents due to hypertension.  Patient had symptoms of chest pounding 6 days ago.  He was at a restaurant watching TV when he suddenly developed a tingling sensation, blurry vision and also tachycardia.  He endorses some left arm numbness.  States having only 1 drink with some food.  Also endorses diaphoresis and slurred speech.  Called EMS and upon EMS arrival, his blood pressure was elevated with systolics in the 170s.  EMS talked to him and help him calm down after 10 minutes with improvement in blood pressures.  EKG strip performed by EMS was normal.  Transfer to ED was offered but wait time was too long and patient declined.  He subsequently followed up with primary care provider and Zoloft was started.  Referral to psychiatry also given as his symptoms were possibly anxiety related or panic attack.  Patient states having occasional episodes of tingling but not as severe as 6 days ago.  He endorses smoking marijuana for the past 8 to 9 years.  He states feeling similar symptoms after smoking marijuana.  He denies any history of heart disease.  He states not liking to take medications.  He took Zoloft for about a week and then stop since it did not help his symptoms much.  Past Medical History:    Diagnosis Date  . Anxiety   . Chicken pox   . GERD (gastroesophageal reflux disease)     History reviewed. No pertinent surgical history.  Current Medications: No outpatient medications have been marked as taking for the 03/16/20 encounter (Office Visit) with Debbe Odea, MD.     Allergies:   Penicillins   Social History   Socioeconomic History  . Marital status: Married    Spouse name: Not on file  . Number of children: Not on file  . Years of education: Not on file  . Highest education level: Not on file  Occupational History  . Not on file  Tobacco Use  . Smoking status: Current Some Day Smoker  . Smokeless tobacco: Never Used  . Tobacco comment: Quit 2010-smoke one cigarette occassionally  Vaping Use  . Vaping Use: Never used  Substance and Sexual Activity  . Alcohol use: Yes    Comment: ocassionally  . Drug use: Never  . Sexual activity: Yes  Other Topics Concern  . Not on file  Social History Narrative  . Not on file   Social Determinants of Health   Financial Resource Strain:   . Difficulty of Paying Living Expenses:   Food Insecurity:   . Worried About Programme researcher, broadcasting/film/video in the Last Year:   . Barista in the Last Year:   Transportation Needs:   .  Lack of Transportation (Medical):   Marland Kitchen Lack of Transportation (Non-Medical):   Physical Activity:   . Days of Exercise per Week:   . Minutes of Exercise per Session:   Stress:   . Feeling of Stress :   Social Connections:   . Frequency of Communication with Friends and Family:   . Frequency of Social Gatherings with Friends and Family:   . Attends Religious Services:   . Active Member of Clubs or Organizations:   . Attends Banker Meetings:   Marland Kitchen Marital Status:      Family History: The patient's family history includes Arthritis in his paternal grandfather; Cancer in his maternal grandfather; Diabetes in his father and paternal grandfather; Heart attack in his paternal  grandfather and paternal grandmother; Hypertension in his father; Stroke in his father.  ROS:   Please see the history of present illness.     All other systems reviewed and are negative.  EKGs/Labs/Other Studies Reviewed:    The following studies were reviewed today:   EKG:  EKG is  ordered today.  The ekg ordered today demonstrates sinus rhythm  Recent Labs: 12/05/2019: ALT 9; BUN 10; Creatinine, Ser 0.94; Hemoglobin 15.1; Platelets 234.0; Potassium 4.1; Sodium 139; TSH 0.61  Recent Lipid Panel No results found for: CHOL, TRIG, HDL, CHOLHDL, VLDL, LDLCALC, LDLDIRECT  Physical Exam:    VS:  BP 130/80 (BP Location: Right Arm, Patient Position: Sitting, Cuff Size: Normal)   Pulse 97   Ht 6' (1.829 m)   Wt 147 lb 6 oz (66.8 kg)   SpO2 98%   BMI 19.99 kg/m     Wt Readings from Last 3 Encounters:  03/16/20 147 lb 6 oz (66.8 kg)  03/12/20 149 lb 6.4 oz (67.8 kg)  12/05/19 149 lb (67.6 kg)     GEN:  Well nourished, well developed in no acute distress HEENT: Normal NECK: No JVD; No carotid bruits LYMPHATICS: No lymphadenopathy CARDIAC: RRR, no murmurs, rubs, gallops RESPIRATORY:  Clear to auscultation without rales, wheezing or rhonchi  ABDOMEN: Soft, non-tender, non-distended MUSCULOSKELETAL:  No edema; No deformity  SKIN: Warm and dry NEUROLOGIC:  Alert and oriented x 3 PSYCHIATRIC:  Normal affect   ASSESSMENT:    1. History of hypertensive crisis    PLAN:    In order of problems listed above:  1. Patient being seen for an episode of hypertension.  Symptoms consistent with possible anxiety/panic attack.  His blood pressure today is normal.  Patient advised to stop smoking marijuana as he is not sure of the content.  His symptoms are usually improved when he does not smoke.  Advised to keep follow-up appointment with primary and psychiatry for management of his anxiety symptoms.  Blood pressure check at home was recommended.  If he stays hypotensive or tachycardic, we  may consider beta-blocker for management.  Patient will not want to take medications at this time.  He plans to follow-up if symptoms recur or blood pressure is elevated.   Medication Adjustments/Labs and Tests Ordered: Current medicines are reviewed at length with the patient today.  Concerns regarding medicines are outlined above.  Orders Placed This Encounter  Procedures  . EKG 12-Lead   No orders of the defined types were placed in this encounter.   Patient Instructions  Medication Instructions:  No changes  *If you need a refill on your cardiac medications before your next appointment, please call your pharmacy*   Lab Work: None  If you have labs (blood  work) drawn today and your tests are completely normal, you will receive your results only by: Marland Kitchen MyChart Message (if you have MyChart) OR . A paper copy in the mail If you have any lab test that is abnormal or we need to change your treatment, we will call you to review the results.   Testing/Procedures: None   Follow-Up: At [facility], you and your health needs are our priority.  As part of our continuing mission to provide you with exceptional heart care, we have created designated Provider Care Teams.  These Care Teams include your primary Cardiologist (physician) and Advanced Practice Providers (APPs -  Physician Assistants and Nurse Practitioners) who all work together to provide you with the care you need, when you need it.  We recommend signing up for the patient portal called "MyChart".  Sign up information is provided on this After Visit Summary.  MyChart is used to connect with patients for Virtual Visits (Telemedicine).  Patients are able to view lab/test results, encounter notes, upcoming appointments, etc.  Non-urgent messages can be sent to your provider as well.   To learn more about what you can do with MyChart, go to [email or web address].    Your next appointment:   Follow up as needed     Signed, Kate Sable, MD  03/16/2020 11:54 AM    [place]

## 2020-03-16 NOTE — Patient Instructions (Signed)

## 2020-03-19 NOTE — Telephone Encounter (Signed)
Patient advised about below and will pick up supplies for urine study as soon as he can.

## 2020-04-05 ENCOUNTER — Other Ambulatory Visit: Payer: Self-pay

## 2020-04-05 ENCOUNTER — Ambulatory Visit: Payer: 59 | Admitting: Licensed Clinical Social Worker

## 2020-04-05 ENCOUNTER — Telehealth: Payer: Self-pay | Admitting: Licensed Clinical Social Worker

## 2020-04-05 NOTE — Telephone Encounter (Signed)
Therapist called patient after no showing virtual appointment. Therapist had sent email and text invites with no response. After 30 minutes therapist then called patient using [employer]'s call feature and pt answered. Pt reported that he was on the road and would prefer to reschedule appointment for next week via phone call. Appointment rescheduled.

## 2020-04-11 ENCOUNTER — Encounter: Payer: Self-pay | Admitting: Licensed Clinical Social Worker

## 2020-04-11 ENCOUNTER — Other Ambulatory Visit: Payer: Self-pay

## 2020-04-11 ENCOUNTER — Ambulatory Visit (INDEPENDENT_AMBULATORY_CARE_PROVIDER_SITE_OTHER): Payer: 59 | Admitting: Licensed Clinical Social Worker

## 2020-04-11 ENCOUNTER — Ambulatory Visit: Payer: 59 | Admitting: Nurse Practitioner

## 2020-04-11 DIAGNOSIS — Z0289 Encounter for other administrative examinations: Secondary | ICD-10-CM

## 2020-04-11 DIAGNOSIS — F419 Anxiety disorder, unspecified: Secondary | ICD-10-CM | POA: Diagnosis not present

## 2020-04-11 NOTE — Progress Notes (Signed)
Patient Location: Home  Provider Location: Home Office   Virtual Visit via Telephone Note  I connected with Keith Frost on 04/11/20 at  9:00 AM EDT by telephone and verified that I am speaking with the correct person using two identifiers.   I discussed the limitations, risks, security and privacy concerns of performing an evaluation and management service by telephone and the availability of in person appointments. I also discussed with the patient that there may be a patient responsible charge related to this service. The patient expressed understanding and agreed to proceed.  THERAPY PROGRESS NOTE  Session Time: 30 Minutes  Participation Level: Active  Behavioral Response: AlertAnxious  Type of Therapy: Individual Therapy  Treatment Goals addressed: Anger, Anxiety and Coping  Interventions: CBT  Summary: [name] is a 34 y.o. male who presents with anxiety sxs. Pt reported that he continues to have difficulty adjusting to the separation and pending divorce from his wife. Pt described experiencing a panic attack about 3 weeks ago and identified stressors that likely triggered the episode. Pt reported that he continues to be noncompliant with medication prescribed and does not want to take a pill every day, only on PRN. Pt identified potential solutions to resolving anxiety triggers and discussed pros and cons with therapist.   Suicidal/Homicidal: No  Therapist Response: Therapist met with patient for follow up session after returning from leave. Therapist reviewed treatment plan and update with patient. Pt has made little to no progress at this time. Therapist encouraged patient to speak with his prescriber regarding medication concerns. Pt agreed. Therapist engaged patient in discussion of pros and cons of potential actions that patient is considering to work through his stressors. The call dropped and was unable to be reconnected in time to complete full session. Therapist to  contact patient to schedule follow up appointment.  Plan: Return again in 2 weeks.  Diagnosis: Axis I: Generalized Anxiety Disorder    Axis II: N/A   Follow Up Instructions:  I discussed the treatment plan with the patient. The patient was provided an opportunity to ask questions and all were answered. The patient agreed with the plan and demonstrated an understanding of the instructions.   The patient was advised to call back or seek an in-person evaluation if the symptoms worsen or if the condition fails to improve as anticipated.  I provided 30 minutes of non-face-to-face time during this encounter.   Jaycob Mcclenton Wynelle Link, LCSW, LCAS

## 2020-04-25 ENCOUNTER — Ambulatory Visit (INDEPENDENT_AMBULATORY_CARE_PROVIDER_SITE_OTHER): Payer: 59 | Admitting: Licensed Clinical Social Worker

## 2020-04-25 ENCOUNTER — Other Ambulatory Visit: Payer: Self-pay

## 2020-04-25 ENCOUNTER — Encounter: Payer: Self-pay | Admitting: Licensed Clinical Social Worker

## 2020-04-25 DIAGNOSIS — F419 Anxiety disorder, unspecified: Secondary | ICD-10-CM | POA: Diagnosis not present

## 2020-04-25 NOTE — Progress Notes (Signed)
Patient Location: Home  Provider Location: Home Office   Virtual Visit via Telephone Note  I connected with Kennen B Alvelo on 04/25/20 at  9:00 AM EDT by telephone and verified that I am speaking with the correct person using two identifiers.   I discussed the limitations, risks, security and privacy concerns of performing an evaluation and management service by telephone and the availability of in person appointments. I also discussed with the patient that there may be a patient responsible charge related to this service. The patient expressed understanding and agreed to proceed.  THERAPY PROGRESS NOTE  Session Time: 30 Minutes  Participation Level: Active  Behavioral Response: AlertDepressed  Type of Therapy: Individual Therapy  Treatment Goals addressed: Coping  Interventions: CBT  Summary: Ory B Devlin is a 34 y.o. male who presents with depression and anxiety sxs. Pt reported experiencing work related stress and difficulty accepting his spouse's affair and pending divorce. Pt reported he copes by "keeping busy" and is uncertain about his future 5-10 years from now. Pt reported a lack of motivation as main issue.    Suicidal/Homicidal: No  Therapist Response: Therapist met with patient for follow up session. Thrapist and patient discussed current stressors and desires to move on from his relationship with spouse. Therapist provided psycho-education around behavioral activation and use of sublimation as steps towards healing from dissolution of his marriage. Pt was receptive.  Plan: Return again in 1 month.  Diagnosis: Axis I: Anxiety Disorder NOS    Axis II: N/A  Lauren P O'Reilly, LCSW, LCAS 04/25/2020     

## 2020-05-24 ENCOUNTER — Other Ambulatory Visit: Payer: Self-pay

## 2020-05-24 ENCOUNTER — Ambulatory Visit: Payer: 59 | Admitting: Licensed Clinical Social Worker

## 2023-04-17 NOTE — Progress Notes (Signed)
" °  Subjective:    Chief Complaint  Patient presents with   Laceration    Cut to right hand 04/15/23 Cut is on right palm - close to middle and ring finger Pt states he cut hand on a piece of metal - pt tripped and grabbed onto a metal pole to brace fall  Last TDAP unknown      History was provided by the patient. Keith Frost is a 37 y.o., male  who presents with 1.5 cm transverse laceration to his right mid palm from 2 days prior.  Reports had initial discomfort, none since.  Worked all day yesterday without difficulty.  Recognizes that he needs his tetanus updated.  Was able to come in today because it is raining.  No complaints of headache or dizziness; no chest pain or shortness of breath; no nausea, vomiting, diarrhea; no fevers or chills.  No attempt to treat prior to arrival. Symptoms include: above   Patient denies: above Treatment to date: above   No past medical history on file. The following portions of the patient's history were reviewed and updated as appropriate: allergies, current medications, past social history, and problem list.  Review of Systems A complete review of systems was performed.  Positive and pertinent negative responses are documented in the HPI, and all other systems are negative.   Objective:     Vitals:   04/17/23 0822  BP: 136/78  Pulse: 85  Temp: 36.2 C (97.1 F)  SpO2: 99%  Weight: 64.4 kg (142 lb)  Height: 185.4 cm (6' 1)  PainSc: 0-No pain    General Appearance:  Well-developed, well-nourished.  Pleasant and cooperative.  No acute distress. HEENT:  /AT, PERRLA, EOMI, sclera clear, conjunctivae pink.   Neck:  Supple, no JVD or adenopathy. Cardiovascular:  Regular rate and rhythm.  Lungs:  Clear to auscultation. Abdomen: Benign. Extremities:  Full range of motion, no joint deformities.  No cyanosis, clubbing, or edema.  Right hand-1.5 cm laceration transverse, mid right palm.  Overlying right third metacarpal.  Full range of  motion.  No swelling, ecchymosis, erythema, purulence.  Neurovascular intact. Neuro:  Alert and orient x4; nonfocal.    Lab/X-ray/Treatments:   Sling/Ace wrap-applied here  Tdap-updated       Assessment:      1. Hand injury, right, initial encounter -     Elastic bandage  2. Open wound of right hand without foreign body, unspecified wound type, initial encounter -     cefadroxil (DURICEF) 500 mg capsule; Take 1 capsule (500 mg total) by mouth 2 (two) times daily for 10 days  Dispense: 20 capsule; Refill: 0 -     Tdap (>=25YR) vaccine (Adacel/Boostrix)(No Medicare Patients) -     Elastic bandage   Cefadroxil, Tdap, local wound care.  Work note with restrictions provided.  Call return if any further problems.    Plan:   Requested Prescriptions   Signed Prescriptions Disp Refills   cefadroxil (DURICEF) 500 mg capsule 20 capsule 0    Sig: Take 1 capsule (500 mg total) by mouth 2 (two) times daily for 10 days    "

## 2024-10-10 ENCOUNTER — Ambulatory Visit
Admission: EM | Admit: 2024-10-10 | Discharge: 2024-10-10 | Disposition: A | Discharge status: Home / Self Care | Payer: Self-pay | Attending: Emergency Medicine | Admitting: Emergency Medicine

## 2024-10-10 DIAGNOSIS — J0100 Acute maxillary sinusitis, unspecified: Secondary | ICD-10-CM

## 2024-10-10 MED ORDER — CEFDINIR 300 MG PO CAPS: 300.0000 mg | ORAL_CAPSULE | Freq: Two times a day (BID) | ORAL | 0 refills | Status: AC

## 2024-10-10 NOTE — ED Provider Notes (Signed)
 " Keith Frost    CSN: [id number] Arrival date & time: 10/10/24  1019      History   Chief Complaint Chief Complaint  Patient presents with   Nasal Congestion    HPI Keith Frost is a 39 y.o. male.  Patient presents with 4-week history of sinus congestion, sinus pressure, runny nose, postnasal drainage.  He has taken Mucinex occasionally for his symptoms; no OTC medication taken today.  No fever, cough, shortness of breath.  The history is provided by the patient and medical records.    Past Medical History:  Diagnosis Date   Anxiety    Chicken pox    GERD (gastroesophageal reflux disease)     Patient Active Problem List   Diagnosis Date Noted   Panic attack 03/12/2020   History of hypertensive crisis 03/12/2020   Anxiety 11/26/2018   Stress at work 11/26/2018   Upper back pain on left side 11/26/2018    History reviewed. No pertinent surgical history.     Home Medications    Prior to Admission medications  Medication Sig Start Date End Date Taking? Authorizing Provider  cefdinir  (OMNICEF ) 300 MG capsule Take 1 capsule (300 mg total) by mouth 2 (two) times daily for 10 days. 10/10/24 10/20/24 Yes Corlis Burnard DEL, NP    Family History Family History  Problem Relation Age of Onset   Diabetes Father    Stroke Father    Hypertension Father    Cancer Maternal Grandfather    Heart attack Paternal Grandmother    Diabetes Paternal Grandfather    Arthritis Paternal Grandfather    Heart attack Paternal Grandfather     Social History Social History[1]   Allergies   Penicillins   Review of Systems Review of Systems  Constitutional:  Negative for chills and fever.  HENT:  Positive for congestion, postnasal drip, rhinorrhea and sinus pressure. Negative for ear pain and sore throat.   Respiratory:  Negative for cough and shortness of breath.      Physical Exam Triage Vital Signs ED Triage Vitals  Encounter Vitals Group     BP 10/10/24 1117 (!)  135/100     Girls Systolic BP Percentile --      Girls Diastolic BP Percentile --      Boys Systolic BP Percentile --      Boys Diastolic BP Percentile --      Pulse Rate 10/10/24 1117 72     Resp 10/10/24 1117 18     Temp 10/10/24 1117 97.9 F (36.6 C)     Temp src --      SpO2 10/10/24 1117 97 %     Weight --      Height --      Head Circumference --      Peak Flow --      Pain Score 10/10/24 1124 3     Pain Loc --      Pain Education --      Exclude from Growth Chart --    No data found.  Updated Vital Signs BP 111/71   Pulse 72   Temp 97.9 F (36.6 C)   Resp 18   SpO2 97%   Visual Acuity Right Eye Distance:   Left Eye Distance:   Bilateral Distance:    Right Eye Near:   Left Eye Near:    Bilateral Near:     Physical Exam Constitutional:      General: He is not in acute distress.  HENT:     Right Ear: Tympanic membrane normal.     Left Ear: Tympanic membrane normal.     Nose: Congestion and rhinorrhea present.     Mouth/Throat:     Mouth: Mucous membranes are moist.     Pharynx: Oropharynx is clear.  Cardiovascular:     Rate and Rhythm: Normal rate and regular rhythm.     Heart sounds: Normal heart sounds.  Pulmonary:     Effort: Pulmonary effort is normal. No respiratory distress.     Breath sounds: Normal breath sounds.  Neurological:     Mental Status: He is alert.      UC Treatments / Results  Labs (all labs ordered are listed, but only abnormal results are displayed) Labs Reviewed - No data to display  EKG   Radiology No results found.  Procedures Procedures (including critical care time)  Medications Ordered in UC Medications - No data to display  Initial Impression / Assessment and Plan / UC Course  I have reviewed the triage vital signs and the nursing notes.  Pertinent labs & imaging results that were available during my care of the patient were reviewed by me and considered in my medical decision making (see chart for  details).    Acute sinusitis.  Afebrile.  Lungs are clear and O2 sat is 97% on room air.  Treating today with cefdinir .  Tylenol or ibuprofen as needed, plain Mucinex as needed.  Education provided on sinus infection.  Instructed him to follow-up with his PCP if he is not improving.  He agrees to plan of care.  Final Clinical Impressions(s) / UC Diagnoses   Final diagnoses:  Acute non-recurrent maxillary sinusitis     Discharge Instructions      Take cefdinir  as directed.  Follow-up with your primary care provider if your symptoms are not improving.      ED Prescriptions     Medication Sig Dispense Auth. Provider   cefdinir  (OMNICEF ) 300 MG capsule Take 1 capsule (300 mg total) by mouth 2 (two) times daily for 10 days. 20 capsule Corlis Burnard DEL, NP      PDMP not reviewed this encounter.    [1]  Social History Tobacco Use   Smoking status: Some Days   Smokeless tobacco: Never   Tobacco comments:    Quit 2010-smoke one cigarette occassionally  Vaping Use   Vaping status: Never Used  Substance Use Topics   Alcohol use: Yes    Comment: ocassionally   Drug use: Never     Corlis Burnard DEL, NP 10/10/24 1200  "

## 2024-10-10 NOTE — Discharge Instructions (Addendum)
 Take cefdinir  as directed.  Follow-up with your primary care provider if your symptoms are not improving.

## 2024-10-10 NOTE — ED Triage Notes (Signed)
 Patient to Urgent Care with complaints of sinus pain/ nasal congestion.  Symptoms x1 month.   Occasionally taking mucinex/ nyquil.
